# Patient Record
Sex: Male | Born: 2005 | Race: White | Hispanic: No | Marital: Single | Smoking: Never smoker
Health system: Southern US, Community
[De-identification: ages and names within clinical notes are randomized; demographics above are authoritative.]

## PROBLEM LIST (undated history)

## (undated) DIAGNOSIS — F8089 Other developmental disorders of speech and language: Secondary | ICD-10-CM

## (undated) DIAGNOSIS — J050 Acute obstructive laryngitis [croup]: Secondary | ICD-10-CM

## (undated) DIAGNOSIS — K5900 Constipation, unspecified: Secondary | ICD-10-CM

## (undated) DIAGNOSIS — L309 Dermatitis, unspecified: Secondary | ICD-10-CM

## (undated) HISTORY — DX: Other developmental disorders of speech and language: F80.89

## (undated) HISTORY — DX: Dermatitis, unspecified: L30.9

## (undated) HISTORY — DX: Constipation, unspecified: K59.00

## (undated) HISTORY — DX: Acute obstructive laryngitis (croup): J05.0

---
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
## 2024-01-01 ENCOUNTER — Encounter: Payer: Self-pay | Admitting: Family Medicine

## 2024-01-01 ENCOUNTER — Ambulatory Visit (INDEPENDENT_AMBULATORY_CARE_PROVIDER_SITE_OTHER): Admitting: Family Medicine

## 2024-01-01 VITALS — BP 120/70 | HR 99 | Temp 98.0°F | Resp 16 | Ht 78.0 in | Wt 270.0 lb

## 2024-01-01 DIAGNOSIS — J029 Acute pharyngitis, unspecified: Secondary | ICD-10-CM | POA: Diagnosis not present

## 2024-01-01 DIAGNOSIS — J0100 Acute maxillary sinusitis, unspecified: Secondary | ICD-10-CM

## 2024-01-01 LAB — POC COVID19 BINAXNOW: SARS Coronavirus 2 Ag: NEGATIVE

## 2024-01-01 LAB — POCT RAPID STREP A (OFFICE): Rapid Strep A Screen: NEGATIVE

## 2024-01-01 MED ORDER — PREDNISONE 20 MG PO TABS: 40.0000 mg | ORAL_TABLET | Freq: Every day | ORAL | 0 refills | Status: AC

## 2024-01-01 NOTE — Progress Notes (Signed)
 Chief Complaint  Patient presents with   Sore Throat    Sore Throat     Larry Oneal here for URI complaints.  Duration: 3 days Associated symptoms: subjective fever, sinus congestion, sinus pain, rhinorrhea, sore throat, and myalgia Denies: itchy watery eyes, ear pain, ear drainage, wheezing, shortness of breath, and coughing Treatment to date: Tylenol, ibuprofen , allergy meds Sick contacts: No  Past Medical History:  Diagnosis Date   Constipation    Croup 11/2007   Eczema    Other developmental speech or language disorder     Objective BP 120/70 (BP Location: Left Arm, Patient Position: Sitting)   Pulse 99   Temp 98 F (36.7 C) (Oral)   Resp 16   Ht 6' 6 (1.981 m)   Wt 270 lb (122.5 kg)   SpO2 98%   BMI 31.20 kg/m  General: Awake, alert, appears stated age HEENT: AT, [name], ears patent b/l and TM's neg, nares patent w/o discharge, pharynx pink and without exudates, MMM, no sinus ttp b/l Neck: No masses or asymmetry Heart: RRR Lungs: CTAB, no accessory muscle use Psych: Age appropriate judgment and insight, normal mood and affect  Acute non-recurrent maxillary sinusitis  5 d pred burst 40 mg/d. Strep and covid testing neg. Cont Tylenol, ibuprofen  prn. Continue to push fluids, practice good hand hygiene, cover mouth when coughing. F/u prn. If starting to experience fevers, shaking, or shortness of breath, seek immediate care. Pt voiced understanding and agreement to the plan.  [name], DO 01/01/24 8:47 AM

## 2024-01-01 NOTE — Addendum Note (Signed)
 Addended by: Makalah [place] M on: 01/01/2024 09:01 AM   Modules accepted: Orders

## 2024-01-01 NOTE — Patient Instructions (Signed)
 Send me a message Friday AM if we are not significantly improved.  No ibuprofen  while on this medicine.  OK to take Tylenol 1000 mg (2 extra strength tabs) or 975 mg (3 regular strength tabs) every 6 hours as needed.  Stay hydrated, practice good hand hygiene.   Consider throat lozenges, salt water gargles and an air humidifier for symptomatic care.   Let us  know if you need anything.

## 2024-03-09 ENCOUNTER — Encounter: Payer: Self-pay | Admitting: Family Medicine

## 2024-03-09 ENCOUNTER — Ambulatory Visit (INDEPENDENT_AMBULATORY_CARE_PROVIDER_SITE_OTHER): Admitting: Family Medicine

## 2024-03-09 VITALS — BP 110/76 | HR 100 | Temp 98.0°F | Resp 16 | Ht 78.0 in | Wt 268.2 lb

## 2024-03-09 DIAGNOSIS — R109 Unspecified abdominal pain: Secondary | ICD-10-CM | POA: Diagnosis not present

## 2024-03-09 NOTE — Patient Instructions (Addendum)
 Try to drink 55-60 oz of water daily outside of exercise.  Take Metamucil or Benefiber daily.  Try 2 tablespoons of milk of mag in 4 oz of warm prune juice. Do that and wait a couple hours. If no improvement, try a Dulcolax suppository and then let me know if we are still having issues.   If anything changes, please let me know.  Let us  know if you need anything.

## 2024-03-09 NOTE — Progress Notes (Signed)
 Chief Complaint  Patient presents with   Digestion     Digestion Problem     Larry Oneal is here for abdominal pain.  Duration: 2 months L latera/upper abd region that comes and goes Sometimes sharp, usually a dull sensation Lasts a few min to up to an hr.  Nighttime awakenings? No Bleeding? Yes- a/w hemorrhoids Weight loss? Nothing intentional Palliation: fiber; sometimes BM's and flatus Provocation: nothing triggers it, movement sometimes makes it worse Associated symptoms: constipation Denies: fever, N/V/D Treatment to date: fiber  Past Medical History:  Diagnosis Date   Constipation    Croup 11/2007   Eczema    Other developmental speech or language disorder     BP 110/76 (BP Location: Left Arm, Patient Position: Sitting)   Pulse 100   Temp 98 F (36.7 C) (Oral)   Resp 16   Ht 6' 6 (1.981 m)   Wt 268 lb 3.2 oz (121.7 kg)   SpO2 97%   BMI 30.99 kg/m  Gen.: Awake, alert, appears stated age HEENT: Mucous membranes moist without mucosal lesions Heart: Regular rate and rhythm without murmurs Lungs: Clear auscultation bilaterally, no rales or wheezing, normal effort without accessory muscle use. Abdomen: Bowel sounds are present. Abdomen is soft, TTP over LLQ and L lateral abd region, nondistended, no masses or organomegaly. Negative Murphy's, Rovsing's, McBurney's, and Carnett's sign. Psych: Age appropriate judgment and insight. Normal mood and affect.  Left sided abdominal pain  Needs to improve hydration. Consider fiber supplement. Consider milk of mg + prune juice prn. Suppository if needed.  F/u as originally scheduled. Pt voiced understanding and agreement to the plan.  Mabel Mt [place], DO 03/09/24 1:09 PM

## 2024-04-26 ENCOUNTER — Telehealth: Admitting: Family

## 2024-04-26 DIAGNOSIS — J029 Acute pharyngitis, unspecified: Secondary | ICD-10-CM

## 2024-04-26 MED ORDER — AMOXICILLIN 500 MG PO CAPS: 500.0000 mg | ORAL_CAPSULE | Freq: Two times a day (BID) | ORAL | 0 refills | Status: AC

## 2024-04-26 NOTE — Progress Notes (Signed)
 Virtual Visit Consent   Larry Oneal, you are scheduled for a virtual visit with a [facility] provider today. Just as with appointments in the office, your consent must be obtained to participate. Your consent will be active for this visit and any virtual visit you may have with one of our providers in the next 365 days. If you have a MyChart account, a copy of this consent can be sent to you electronically.  As this is a virtual visit, video technology does not allow for your provider to perform a traditional examination. This may limit your provider's ability to fully assess your condition. If your provider identifies any concerns that need to be evaluated in person or the need to arrange testing (such as labs, EKG, etc.), we will make arrangements to do so. Although advances in technology are sophisticated, we cannot ensure that it will always work on either your end or our end. If the connection with a video visit is poor, the visit may have to be switched to a telephone visit. With either a video or telephone visit, we are not always able to ensure that we have a secure connection.  By engaging in this virtual visit, you consent to the provision of healthcare and authorize for your insurance to be billed (if applicable) for the services provided during this visit. Depending on your insurance coverage, you may receive a charge related to this service.  I need to obtain your verbal consent now. Are you willing to proceed with your visit today? Larry Oneal has provided verbal consent on 04/26/2024 for a virtual visit (video or telephone). Bari Learn, FNP  Date: 04/26/2024 7:14 PM   Virtual Visit via Video Note   I, Bari Learn, connected with  Larry Oneal  ([id number], 11-May-2006) on 04/26/24 at  7:15 PM EST by a video-enabled telemedicine application and verified that I am speaking with the correct person using two identifiers.  Location: Patient: Virtual Visit Location  Patient: Home Provider: Virtual Visit Location Provider: Home Office   I discussed the limitations of evaluation and management by telemedicine and the availability of in person appointments. The patient expressed understanding and agreed to proceed.    History of Present Illness: Larry Oneal is a 18 y.o. who identifies as a male who was assigned male at birth, and is being seen today for sore throat and fever that started yesterday.  HPI: Sore Throat  This is a new problem. The current episode started yesterday. The problem has been unchanged. Associated symptoms include congestion, headaches and trouble swallowing. Pertinent negatives include no coughing, ear pain or swollen glands. Associated symptoms comments: chills. He has tried acetaminophen and NSAIDs for the symptoms. The treatment provided mild relief.    Problems:  Patient Active Problem List   Diagnosis Date Noted   Closed fracture of left distal radius 03/27/2021   Viral warts 01/24/2018   Attention deficit hyperactivity disorder (ADHD) 12/22/2009   OTHER DEVELOPMENTAL SPEECH OR LANGUAGE DISORDER 07/13/2008   ECZEMA 01/20/2007    Allergies: No Known Allergies Medications:  Current Outpatient Medications:    amoxicillin  (AMOXIL ) 500 MG capsule, Take 1 capsule (500 mg total) by mouth 2 (two) times daily for 10 days., Disp: 20 capsule, Rfl: 0   levalbuterol  (XOPENEX  HFA) 45 MCG/ACT inhaler, Inhale 1-2 puffs into the lungs every 4 (four) hours as needed for wheezing (Cough)., Disp: 15 g, Rfl: 5   Melatonin 3 MG TABS, Take 1 tablet by mouth at bedtime., Disp: , Rfl:  Observations/Objective: Patient is well-developed, well-nourished in no acute distress.  Resting comfortably  at home.  Head is normocephalic, atraumatic.  No labored breathing.  Speech is clear and coherent with logical content.  Patient is alert and oriented at baseline.  Throat erythemas   Assessment and Plan: 1. Acute pharyngitis, unspecified  etiology (Primary) - amoxicillin  (AMOXIL ) 500 MG capsule; Take 1 capsule (500 mg total) by mouth 2 (two) times daily for 10 days.  Dispense: 20 capsule; Refill: 0  - Take meds as prescribed - Use a cool mist humidifier  -Use saline nose sprays frequently -Force fluids -For any cough or congestion  Use plain Mucinex- regular strength or max strength is fine -For fever or aces or pains- take tylenol or ibuprofen . -Throat lozenges if help -New toothbrush in 3 days Follow up if symptoms worsen or do not improve   Follow Up Instructions: I discussed the assessment and treatment plan with the patient. The patient was provided an opportunity to ask questions and all were answered. The patient agreed with the plan and demonstrated an understanding of the instructions.  A copy of instructions were sent to the patient via MyChart unless otherwise noted below.     The patient was advised to call back or seek an in-person evaluation if the symptoms worsen or if the condition fails to improve as anticipated.    Bari Learn, FNP
